# Patient Record
Sex: Female | Born: 1950 | Race: White | Hispanic: No | Marital: Married | Smoking: Never smoker
Health system: Southern US, Community
[De-identification: ages and names within clinical notes are randomized; demographics above are authoritative.]

## PROBLEM LIST (undated history)

## (undated) DIAGNOSIS — Z9889 Other specified postprocedural states: Secondary | ICD-10-CM

## (undated) DIAGNOSIS — L639 Alopecia areata, unspecified: Secondary | ICD-10-CM

## (undated) DIAGNOSIS — R112 Nausea with vomiting, unspecified: Secondary | ICD-10-CM

## (undated) DIAGNOSIS — T8859XA Other complications of anesthesia, initial encounter: Secondary | ICD-10-CM

## (undated) DIAGNOSIS — T4145XA Adverse effect of unspecified anesthetic, initial encounter: Secondary | ICD-10-CM

## (undated) HISTORY — PX: TMJ ARTHROSCOPY: SHX1067

## (undated) HISTORY — PX: ORIF DISTAL RADIUS FRACTURE: SUR927

## (undated) HISTORY — PX: HEMORRHOID SURGERY: SHX153

## (undated) HISTORY — PX: WISDOM TOOTH EXTRACTION: SHX21

## (undated) HISTORY — PX: BACK SURGERY: SHX140

---

## 1997-07-12 ENCOUNTER — Other Ambulatory Visit: Admission: RE | Admit: 1997-07-12 | Discharge: 1997-07-12 | Payer: Self-pay | Admitting: Obstetrics and Gynecology

## 1998-06-12 ENCOUNTER — Ambulatory Visit (HOSPITAL_COMMUNITY): Admission: RE | Admit: 1998-06-12 | Discharge: 1998-06-12 | Payer: Self-pay | Admitting: Neurosurgery

## 1998-06-12 ENCOUNTER — Encounter: Payer: Self-pay | Admitting: Neurosurgery

## 1998-06-28 ENCOUNTER — Encounter: Payer: Self-pay | Admitting: Neurosurgery

## 1998-06-28 ENCOUNTER — Ambulatory Visit (HOSPITAL_COMMUNITY): Admission: RE | Admit: 1998-06-28 | Discharge: 1998-06-28 | Payer: Self-pay | Admitting: Neurosurgery

## 1998-07-12 ENCOUNTER — Ambulatory Visit (HOSPITAL_COMMUNITY): Admission: RE | Admit: 1998-07-12 | Discharge: 1998-07-12 | Payer: Self-pay | Admitting: Neurosurgery

## 1998-07-12 ENCOUNTER — Encounter: Payer: Self-pay | Admitting: Neurosurgery

## 1999-02-13 ENCOUNTER — Other Ambulatory Visit: Admission: RE | Admit: 1999-02-13 | Discharge: 1999-02-13 | Payer: Self-pay | Admitting: Obstetrics and Gynecology

## 1999-04-29 ENCOUNTER — Emergency Department (HOSPITAL_COMMUNITY): Admission: EM | Admit: 1999-04-29 | Discharge: 1999-04-29 | Payer: Self-pay | Admitting: Emergency Medicine

## 1999-05-02 ENCOUNTER — Encounter (HOSPITAL_COMMUNITY): Admission: RE | Admit: 1999-05-02 | Discharge: 1999-07-31 | Payer: Self-pay | Admitting: Emergency Medicine

## 1999-06-04 ENCOUNTER — Ambulatory Visit (HOSPITAL_COMMUNITY): Admission: RE | Admit: 1999-06-04 | Discharge: 1999-06-04 | Payer: Self-pay | Admitting: Gastroenterology

## 2000-08-05 ENCOUNTER — Ambulatory Visit (HOSPITAL_BASED_OUTPATIENT_CLINIC_OR_DEPARTMENT_OTHER): Admission: RE | Admit: 2000-08-05 | Discharge: 2000-08-06 | Payer: Self-pay | Admitting: Surgery

## 2001-04-15 ENCOUNTER — Encounter: Payer: Self-pay | Admitting: Neurosurgery

## 2001-04-15 ENCOUNTER — Encounter: Admission: RE | Admit: 2001-04-15 | Discharge: 2001-04-15 | Payer: Self-pay | Admitting: Neurosurgery

## 2001-05-01 ENCOUNTER — Encounter: Payer: Self-pay | Admitting: Neurosurgery

## 2001-05-01 ENCOUNTER — Encounter: Admission: RE | Admit: 2001-05-01 | Discharge: 2001-05-01 | Payer: Self-pay | Admitting: Neurosurgery

## 2001-05-15 ENCOUNTER — Encounter: Admission: RE | Admit: 2001-05-15 | Discharge: 2001-05-15 | Payer: Self-pay | Admitting: Neurosurgery

## 2001-05-15 ENCOUNTER — Encounter: Payer: Self-pay | Admitting: Neurosurgery

## 2001-07-29 ENCOUNTER — Inpatient Hospital Stay (HOSPITAL_COMMUNITY): Admission: RE | Admit: 2001-07-29 | Discharge: 2001-07-31 | Payer: Self-pay | Admitting: Neurosurgery

## 2001-07-29 ENCOUNTER — Encounter: Payer: Self-pay | Admitting: Neurosurgery

## 2001-09-18 ENCOUNTER — Encounter: Admission: RE | Admit: 2001-09-18 | Discharge: 2001-09-18 | Payer: Self-pay

## 2001-10-30 ENCOUNTER — Encounter: Admission: RE | Admit: 2001-10-30 | Discharge: 2001-10-30 | Payer: Self-pay

## 2001-12-09 ENCOUNTER — Emergency Department (HOSPITAL_COMMUNITY): Admission: EM | Admit: 2001-12-09 | Discharge: 2001-12-09 | Payer: Self-pay | Admitting: *Deleted

## 2002-02-04 HISTORY — PX: LUMBAR LAMINECTOMY: SHX95

## 2002-07-02 ENCOUNTER — Encounter: Payer: Self-pay | Admitting: *Deleted

## 2002-07-02 ENCOUNTER — Encounter: Admission: RE | Admit: 2002-07-02 | Discharge: 2002-07-02 | Payer: Self-pay | Admitting: *Deleted

## 2003-12-30 ENCOUNTER — Emergency Department (HOSPITAL_COMMUNITY): Admission: EM | Admit: 2003-12-30 | Discharge: 2003-12-31 | Payer: Self-pay | Admitting: Emergency Medicine

## 2004-02-01 ENCOUNTER — Encounter: Admission: RE | Admit: 2004-02-01 | Discharge: 2004-02-01 | Payer: Self-pay | Admitting: Internal Medicine

## 2013-10-22 ENCOUNTER — Ambulatory Visit (HOSPITAL_COMMUNITY)
Admission: RE | Admit: 2013-10-22 | Discharge: 2013-10-22 | Disposition: A | Discharge status: Home / Self Care | Payer: No Typology Code available for payment source | Source: Ambulatory Visit | Attending: Internal Medicine | Admitting: Internal Medicine

## 2013-10-22 ENCOUNTER — Other Ambulatory Visit (HOSPITAL_COMMUNITY): Payer: Self-pay | Admitting: Internal Medicine

## 2013-10-22 DIAGNOSIS — K5732 Diverticulitis of large intestine without perforation or abscess without bleeding: Secondary | ICD-10-CM | POA: Diagnosis not present

## 2013-10-22 DIAGNOSIS — N859 Noninflammatory disorder of uterus, unspecified: Secondary | ICD-10-CM | POA: Insufficient documentation

## 2013-10-22 DIAGNOSIS — R1032 Left lower quadrant pain: Secondary | ICD-10-CM

## 2013-10-22 MED ORDER — IOHEXOL 300 MG/ML  SOLN
100.0000 mL | Freq: Once | INTRAMUSCULAR | Status: AC | PRN
  Administered 2013-10-22: 100 mL via INTRAVENOUS

## 2013-12-02 ENCOUNTER — Other Ambulatory Visit: Payer: Self-pay | Admitting: Gastroenterology

## 2014-02-15 ENCOUNTER — Other Ambulatory Visit: Payer: Self-pay | Admitting: Gastroenterology

## 2014-02-17 ENCOUNTER — Encounter (HOSPITAL_COMMUNITY): Payer: Self-pay | Admitting: *Deleted

## 2014-03-01 ENCOUNTER — Ambulatory Visit (HOSPITAL_COMMUNITY)
Admission: RE | Admit: 2014-03-01 | Payer: No Typology Code available for payment source | Source: Ambulatory Visit | Admitting: Gastroenterology

## 2014-03-01 HISTORY — DX: Other specified postprocedural states: R11.2

## 2014-03-01 HISTORY — DX: Other complications of anesthesia, initial encounter: T88.59XA

## 2014-03-01 HISTORY — DX: Other specified postprocedural states: Z98.890

## 2014-03-01 HISTORY — DX: Adverse effect of unspecified anesthetic, initial encounter: T41.45XA

## 2014-03-01 SURGERY — COLONOSCOPY WITH PROPOFOL
Anesthesia: Monitor Anesthesia Care

## 2014-03-23 ENCOUNTER — Other Ambulatory Visit: Payer: Self-pay | Admitting: Gastroenterology

## 2014-04-27 ENCOUNTER — Ambulatory Visit
Admission: RE | Admit: 2014-04-27 | Discharge: 2014-04-27 | Disposition: A | Discharge status: Home / Self Care | Payer: No Typology Code available for payment source | Source: Ambulatory Visit | Attending: Internal Medicine | Admitting: Internal Medicine

## 2014-04-27 ENCOUNTER — Other Ambulatory Visit: Payer: Self-pay | Admitting: Internal Medicine

## 2014-04-27 DIAGNOSIS — O864 Pyrexia of unknown origin following delivery: Secondary | ICD-10-CM

## 2014-05-23 ENCOUNTER — Encounter (HOSPITAL_COMMUNITY): Payer: Self-pay | Admitting: *Deleted

## 2014-06-06 ENCOUNTER — Ambulatory Visit (HOSPITAL_COMMUNITY)
Admission: RE | Admit: 2014-06-06 | Payer: No Typology Code available for payment source | Source: Ambulatory Visit | Admitting: Gastroenterology

## 2014-06-06 ENCOUNTER — Encounter (HOSPITAL_COMMUNITY): Admission: RE | Payer: Self-pay | Source: Ambulatory Visit

## 2014-06-06 HISTORY — DX: Alopecia areata, unspecified: L63.9

## 2014-06-06 SURGERY — COLONOSCOPY WITH PROPOFOL
Anesthesia: Monitor Anesthesia Care

## 2014-06-23 ENCOUNTER — Encounter (HOSPITAL_COMMUNITY): Payer: Self-pay | Admitting: *Deleted

## 2014-06-23 ENCOUNTER — Other Ambulatory Visit: Payer: Self-pay | Admitting: Gastroenterology

## 2014-06-25 ENCOUNTER — Encounter (HOSPITAL_COMMUNITY): Payer: Self-pay | Admitting: Anesthesiology

## 2014-06-25 NOTE — Anesthesia Preprocedure Evaluation (Deleted)
Anesthesia Evaluation  Patient identified by MRN, date of birth, ID band Patient awake    Reviewed: Allergy & Precautions, NPO status , Patient's Chart, lab work & pertinent test results, reviewed documented beta blocker date and time   History of Anesthesia Complications (+) PONV  Airway        Dental   Pulmonary          Cardiovascular     Neuro/Psych    GI/Hepatic Blood in stool   Endo/Other    Renal/GU      Musculoskeletal   Abdominal   Peds  Hematology   Anesthesia Other Findings   Reproductive/Obstetrics                             Anesthesia Physical Anesthesia Plan  ASA: II  Anesthesia Plan: MAC   Post-op Pain Management:    Induction: Intravenous  Airway Management Planned:   Additional Equipment:   Intra-op Plan:   Post-operative Plan:   Informed Consent: I have reviewed the patients History and Physical, chart, labs and discussed the procedure including the risks, benefits and alternatives for the proposed anesthesia with the patient or authorized representative who has indicated his/her understanding and acceptance.     Plan Discussed with:   Anesthesia Plan Comments:         Anesthesia Quick Evaluation

## 2014-06-27 ENCOUNTER — Ambulatory Visit (HOSPITAL_COMMUNITY)
Admission: RE | Admit: 2014-06-27 | Payer: No Typology Code available for payment source | Source: Ambulatory Visit | Admitting: Gastroenterology

## 2014-06-27 SURGERY — COLONOSCOPY WITH PROPOFOL
Anesthesia: Monitor Anesthesia Care

## 2014-06-30 ENCOUNTER — Other Ambulatory Visit: Payer: Self-pay | Admitting: Gastroenterology

## 2014-07-05 ENCOUNTER — Encounter (HOSPITAL_COMMUNITY): Payer: Self-pay | Admitting: *Deleted

## 2014-07-14 ENCOUNTER — Ambulatory Visit (HOSPITAL_COMMUNITY)
Admission: RE | Admit: 2014-07-14 | Discharge: 2014-07-14 | Disposition: A | Discharge status: Home / Self Care | Payer: No Typology Code available for payment source | Source: Ambulatory Visit | Attending: Gastroenterology | Admitting: Gastroenterology

## 2014-07-14 ENCOUNTER — Encounter (HOSPITAL_COMMUNITY): Payer: Self-pay | Admitting: Anesthesiology

## 2014-07-14 ENCOUNTER — Encounter (HOSPITAL_COMMUNITY)
Admission: RE | Disposition: A | Discharge status: Home / Self Care | Payer: Self-pay | Source: Ambulatory Visit | Attending: Gastroenterology

## 2014-07-14 ENCOUNTER — Ambulatory Visit (HOSPITAL_COMMUNITY): Payer: No Typology Code available for payment source | Admitting: Anesthesiology

## 2014-07-14 DIAGNOSIS — H8109 Meniere's disease, unspecified ear: Secondary | ICD-10-CM | POA: Insufficient documentation

## 2014-07-14 DIAGNOSIS — D120 Benign neoplasm of cecum: Secondary | ICD-10-CM | POA: Insufficient documentation

## 2014-07-14 DIAGNOSIS — D122 Benign neoplasm of ascending colon: Secondary | ICD-10-CM | POA: Insufficient documentation

## 2014-07-14 DIAGNOSIS — Z1211 Encounter for screening for malignant neoplasm of colon: Secondary | ICD-10-CM | POA: Insufficient documentation

## 2014-07-14 HISTORY — PX: COLONOSCOPY WITH PROPOFOL: SHX5780

## 2014-07-14 SURGERY — COLONOSCOPY WITH PROPOFOL
Anesthesia: Monitor Anesthesia Care

## 2014-07-14 MED ORDER — ONDANSETRON HCL 4 MG/2ML IJ SOLN
INTRAMUSCULAR | Status: DC | PRN
  Administered 2014-07-14: 4 mg via INTRAVENOUS

## 2014-07-14 MED ORDER — PROPOFOL INFUSION 10 MG/ML OPTIME
INTRAVENOUS | Status: DC | PRN
  Administered 2014-07-14: 100 ug/kg/min via INTRAVENOUS

## 2014-07-14 MED ORDER — KETAMINE HCL 10 MG/ML IJ SOLN
INTRAMUSCULAR | Status: DC | PRN
  Administered 2014-07-14 (×2): 10 mg via INTRAVENOUS

## 2014-07-14 MED ORDER — PROPOFOL 10 MG/ML IV BOLUS
INTRAVENOUS | Status: AC
  Filled 2014-07-14: qty 20

## 2014-07-14 MED ORDER — KETAMINE HCL 10 MG/ML IJ SOLN
INTRAMUSCULAR | Status: AC
  Filled 2014-07-14: qty 1

## 2014-07-14 MED ORDER — SODIUM CHLORIDE 0.9 % IV SOLN: INTRAVENOUS | Status: DC

## 2014-07-14 MED ORDER — PROPOFOL 10 MG/ML IV BOLUS
INTRAVENOUS | Status: DC | PRN
  Administered 2014-07-14 (×6): 20 mg via INTRAVENOUS

## 2014-07-14 MED ORDER — LACTATED RINGERS IV SOLN
INTRAVENOUS | Status: DC
  Administered 2014-07-14: 11:00:00 via INTRAVENOUS
  Administered 2014-07-14: 1000 mL via INTRAVENOUS

## 2014-07-14 MED ORDER — ONDANSETRON HCL 4 MG/2ML IJ SOLN
INTRAMUSCULAR | Status: AC
  Filled 2014-07-14: qty 2

## 2014-07-14 SURGICAL SUPPLY — 21 items

## 2014-07-14 NOTE — Transfer of Care (Signed)
Immediate Anesthesia Transfer of Care Note  Patient: Michele Griffin  Procedure(s) Performed: Procedure(s): COLONOSCOPY WITH PROPOFOL (N/A)  Patient Location: PACU  Anesthesia Type:MAC  Level of Consciousness:  sedated, patient cooperative and responds to stimulation  Airway & Oxygen Therapy:Patient Spontanous Breathing and Patient connected to face mask oxgen  Post-op Assessment:  Report given to PACU RN and Post -op Vital signs reviewed and stable  Post vital signs:  Reviewed and stable  Last Vitals:  Filed Vitals:   07/14/14 1019  BP: 159/95  Temp: 36.6 C  Resp: 16    Complications: No apparent anesthesia complications

## 2014-07-14 NOTE — Discharge Instructions (Signed)
Colonoscopy, Care After °Refer to this sheet in the next few weeks. These instructions provide you with information on caring for yourself after your procedure. Your health care provider may also give you more specific instructions. Your treatment has been planned according to current medical practices, but problems sometimes occur. Call your health care provider if you have any problems or questions after your procedure. °WHAT TO EXPECT AFTER THE PROCEDURE  °After your procedure, it is typical to have the following: °· A small amount of blood in your stool. °· Moderate amounts of gas and mild abdominal cramping or bloating. °HOME CARE INSTRUCTIONS °· Do not drive, operate machinery, or sign important documents for 24 hours. °· You may shower and resume your regular physical activities, but move at a slower pace for the first 24 hours. °· Take frequent rest periods for the first 24 hours. °· Walk around or put a warm pack on your abdomen to help reduce abdominal cramping and bloating. °· Drink enough fluids to keep your urine clear or pale yellow. °· You may resume your normal diet as instructed by your health care provider. Avoid heavy or fried foods that are hard to digest. °· Avoid drinking alcohol for 24 hours or as instructed by your health care provider. °· Only take over-the-counter or prescription medicines as directed by your health care provider. °· If a tissue sample (biopsy) was taken during your procedure: °¨ Do not take aspirin or blood thinners for 7 days, or as instructed by your health care provider. °¨ Do not drink alcohol for 7 days, or as instructed by your health care provider. °¨ Eat soft foods for the first 24 hours. °SEEK MEDICAL CARE IF: °You have persistent spotting of blood in your stool 2-3 days after the procedure. °SEEK IMMEDIATE MEDICAL CARE IF: °· You have more than a small spotting of blood in your stool. °· You pass large blood clots in your stool. °· Your abdomen is swollen  (distended). °· You have nausea or vomiting. °· You have a fever. °· You have increasing abdominal pain that is not relieved with medicine. °Document Released: 09/05/2003 Document Revised: 11/11/2012 Document Reviewed: 09/28/2012 °[employer]® Patient Information ©2015 [employer]. This information is not intended to replace advice given to you by your health care provider. Make sure you discuss any questions you have with your health care provider. ° °Conscious Sedation, Adult, Care After °Refer to this sheet in the next few weeks. These instructions provide you with information on caring for yourself after your procedure. Your health care provider may also give you more specific instructions. Your treatment has been planned according to current medical practices, but problems sometimes occur. Call your health care provider if you have any problems or questions after your procedure. °WHAT TO EXPECT AFTER THE PROCEDURE  °After your procedure: °· You may feel sleepy, clumsy, and have poor balance for several hours. °· Vomiting may occur if you eat too soon after the procedure. °HOME CARE INSTRUCTIONS °· Do not participate in any activities where you could become injured for at least 24 hours. Do not: °¨ Drive. °¨ Swim. °¨ Ride a bicycle. °¨ Operate heavy machinery. °¨ Cook. °¨ Use power tools. °¨ Climb ladders. °¨ Work from a high place. °· Do not make important decisions or sign legal documents until you are improved. °· If you vomit, drink water, juice, or soup when you can drink without vomiting. Make sure you have little or no nausea before eating solid foods. °·   Only take over-the-counter or prescription medicines for pain, discomfort, or fever as directed by your health care provider. °· Make sure you and your family fully understand everything about the medicines given to you, including what side effects may occur. °· You should not drink alcohol, take sleeping pills, or take medicines that cause drowsiness for  at least 24 hours. °· If you smoke, do not smoke without supervision. °· If you are feeling better, you may resume normal activities 24 hours after you were sedated. °· Keep all appointments with your health care provider. °SEEK MEDICAL CARE IF: °· Your skin is pale or bluish in color. °· You continue to feel nauseous or vomit. °· Your pain is getting worse and is not helped by medicine. °· You have bleeding or swelling. °· You are still sleepy or feeling clumsy after 24 hours. °SEEK IMMEDIATE MEDICAL CARE IF: °· You develop a rash. °· You have difficulty breathing. °· You develop any type of allergic problem. °· You have a fever. °MAKE SURE YOU: °· Understand these instructions. °· Will watch your condition. °· Will get help right away if you are not doing well or get worse. °Document Released: 11/11/2012 Document Reviewed: 11/11/2012 °[employer]® Patient Information ©2015 [employer]. This information is not intended to replace advice given to you by your health care provider. Make sure you discuss any questions you have with your health care provider. ° ° °

## 2014-07-14 NOTE — Anesthesia Preprocedure Evaluation (Signed)
Anesthesia Evaluation  Patient identified by MRN, date of birth, ID band Patient awake    Reviewed: Allergy & Precautions, NPO status , Patient's Chart, lab work & pertinent test results  History of Anesthesia Complications (+) PONV and history of anesthetic complications  Airway Mallampati: II  TM Distance: >3 FB Neck ROM: Full    Dental no notable dental hx.    Pulmonary neg pulmonary ROS,  breath sounds clear to auscultation  Pulmonary exam normal       Cardiovascular negative cardio ROS Normal cardiovascular examRhythm:Regular Rate:Normal     Neuro/Psych negative neurological ROS  negative psych ROS   GI/Hepatic negative GI ROS, Neg liver ROS,   Endo/Other  negative endocrine ROS  Renal/GU negative Renal ROS  negative genitourinary   Musculoskeletal negative musculoskeletal ROS (+)   Abdominal   Peds negative pediatric ROS (+)  Hematology negative hematology ROS (+)   Anesthesia Other Findings   Reproductive/Obstetrics negative OB ROS                             Anesthesia Physical Anesthesia Plan  ASA: II  Anesthesia Plan: MAC   Post-op Pain Management:    Induction: Intravenous  Airway Management Planned: Natural Airway  Additional Equipment:   Intra-op Plan:   Post-operative Plan:   Informed Consent: I have reviewed the patients History and Physical, chart, labs and discussed the procedure including the risks, benefits and alternatives for the proposed anesthesia with the patient or authorized representative who has indicated his/her understanding and acceptance.   Dental advisory given  Plan Discussed with: CRNA  Anesthesia Plan Comments:         Anesthesia Quick Evaluation

## 2014-07-14 NOTE — H&P (Signed)
  Procedure: Screening colonoscopy to evaluate positive Cologuard colon cancer screening test  History: The patient is a 64 year old female born 1950/08/26. She is scheduled to undergo a screening colonoscopy today.  Past medical history: Celiac disease. Mnire's disease. Rosacea. Fatty appearing liver on CT scan performed in September 2015. Hemorrhoidectomy. Right arm fracture surgeries. Lumbar laminectomy.  Medication allergies: None  Exam: The patient is alert and lying comfortably on the endoscopy stretcher. Abdomen is soft and nontender to palpation. Lungs are clear to auscultation. Cardiac exam reveals a regular rhythm.  Plan: Proceed with screening colonoscopy

## 2014-07-14 NOTE — Anesthesia Postprocedure Evaluation (Signed)
  Anesthesia Post-op Note  Patient: Michele Griffin  Procedure(s) Performed: Procedure(s) (LRB): COLONOSCOPY WITH PROPOFOL (N/A)  Patient Location: PACU  Anesthesia Type: MAC  Level of Consciousness: awake and alert   Airway and Oxygen Therapy: Patient Spontanous Breathing  Post-op Pain: mild  Post-op Assessment: Post-op Vital signs reviewed, Patient's Cardiovascular Status Stable, Respiratory Function Stable, Patent Airway and No signs of Nausea or vomiting  Last Vitals:  Filed Vitals:   07/14/14 1150  BP: 138/82  Pulse: 63  Temp:   Resp: 16    Post-op Vital Signs: stable   Complications: No apparent anesthesia complications

## 2014-07-14 NOTE — Op Note (Signed)
Procedure: Screening colonoscopy  Endoscopist: Earle Gell  Premedication: Propofol administered by anesthesia  Procedure: The patient was placed in the left lateral decubitus position. Anal inspection and digital rectal exam were normal. The Pentax pediatric colonoscope was introduced into the rectum and advanced to the cecum. A normal-appearing appendiceal orifice was identified. A normal-appearing ileocecal valve was intubated and the terminal ileum inspected. Colonic preparation for the exam today was good. Withdrawal time was 38 minutes  Rectum. Normal. Retroflexed view of the distal rectum was normal  Sigmoid colon and descending colon. Normal  Splenic flexure. Normal  Transverse colon. Normal  Hepatic flexure. Normal  Ascending colon. From the distal ascending colon, a 6 mm sessile serrated appearing polyp was removed with the snare.  Cecum and ileocecal valve. From the distal cecum, a lipomatous appearing polyp approximately 8 mm in diameter was removed in piecemeal fashion with the electrocautery snare.  Terminal ileum. Normal  Assessment: A small lipomatous appearing polyp in the distal cecum was removed with the electrocautery snare and a small sessile serrated appearing polyp was removed from the distal ascending colon. Otherwise normal colonoscopy.  Recommendation: I will evaluate the polyp pathology to determine when the patient should undergo a repeat colonoscopy

## 2014-07-15 ENCOUNTER — Encounter (HOSPITAL_COMMUNITY): Payer: Self-pay | Admitting: Gastroenterology

## 2014-12-12 ENCOUNTER — Encounter: Payer: Self-pay | Admitting: Obstetrics & Gynecology

## 2015-10-20 ENCOUNTER — Ambulatory Visit (INDEPENDENT_AMBULATORY_CARE_PROVIDER_SITE_OTHER): Payer: No Typology Code available for payment source | Admitting: Psychology

## 2015-10-20 DIAGNOSIS — F330 Major depressive disorder, recurrent, mild: Secondary | ICD-10-CM

## 2015-10-27 ENCOUNTER — Ambulatory Visit (INDEPENDENT_AMBULATORY_CARE_PROVIDER_SITE_OTHER): Payer: No Typology Code available for payment source | Admitting: Psychology

## 2015-10-27 DIAGNOSIS — F330 Major depressive disorder, recurrent, mild: Secondary | ICD-10-CM

## 2015-11-08 ENCOUNTER — Ambulatory Visit: Payer: No Typology Code available for payment source | Admitting: Psychology

## 2015-11-17 ENCOUNTER — Ambulatory Visit (INDEPENDENT_AMBULATORY_CARE_PROVIDER_SITE_OTHER): Payer: No Typology Code available for payment source | Admitting: Psychology

## 2015-11-17 DIAGNOSIS — F330 Major depressive disorder, recurrent, mild: Secondary | ICD-10-CM

## 2015-11-29 ENCOUNTER — Ambulatory Visit (INDEPENDENT_AMBULATORY_CARE_PROVIDER_SITE_OTHER): Payer: No Typology Code available for payment source | Admitting: Psychology

## 2015-11-29 DIAGNOSIS — F330 Major depressive disorder, recurrent, mild: Secondary | ICD-10-CM

## 2015-12-06 ENCOUNTER — Ambulatory Visit (INDEPENDENT_AMBULATORY_CARE_PROVIDER_SITE_OTHER): Payer: No Typology Code available for payment source | Admitting: Psychology

## 2015-12-06 DIAGNOSIS — F330 Major depressive disorder, recurrent, mild: Secondary | ICD-10-CM

## 2015-12-13 ENCOUNTER — Ambulatory Visit (INDEPENDENT_AMBULATORY_CARE_PROVIDER_SITE_OTHER): Payer: No Typology Code available for payment source | Admitting: Psychology

## 2015-12-13 DIAGNOSIS — F330 Major depressive disorder, recurrent, mild: Secondary | ICD-10-CM

## 2015-12-20 ENCOUNTER — Ambulatory Visit (INDEPENDENT_AMBULATORY_CARE_PROVIDER_SITE_OTHER): Payer: No Typology Code available for payment source | Admitting: Psychology

## 2015-12-20 DIAGNOSIS — F330 Major depressive disorder, recurrent, mild: Secondary | ICD-10-CM

## 2015-12-27 ENCOUNTER — Ambulatory Visit: Payer: No Typology Code available for payment source | Admitting: Psychology

## 2016-01-03 ENCOUNTER — Ambulatory Visit (INDEPENDENT_AMBULATORY_CARE_PROVIDER_SITE_OTHER): Payer: No Typology Code available for payment source | Admitting: Psychology

## 2016-01-03 DIAGNOSIS — F330 Major depressive disorder, recurrent, mild: Secondary | ICD-10-CM

## 2016-01-10 ENCOUNTER — Ambulatory Visit: Payer: No Typology Code available for payment source | Admitting: Psychology

## 2016-01-17 ENCOUNTER — Ambulatory Visit (INDEPENDENT_AMBULATORY_CARE_PROVIDER_SITE_OTHER): Payer: No Typology Code available for payment source | Admitting: Psychology

## 2016-01-17 DIAGNOSIS — F330 Major depressive disorder, recurrent, mild: Secondary | ICD-10-CM | POA: Diagnosis not present

## 2016-01-24 ENCOUNTER — Ambulatory Visit (INDEPENDENT_AMBULATORY_CARE_PROVIDER_SITE_OTHER): Payer: No Typology Code available for payment source | Admitting: Psychology

## 2016-01-24 DIAGNOSIS — F330 Major depressive disorder, recurrent, mild: Secondary | ICD-10-CM | POA: Diagnosis not present

## 2016-01-26 IMAGING — CR DG CHEST 2V
2 series · 2 of 2 positions shown · non-contrast
Comparison: None.

CLINICAL DATA: Persistent cough and fever.

EXAM:
CHEST  2 VIEW

[view not recorded (1 of 2)]
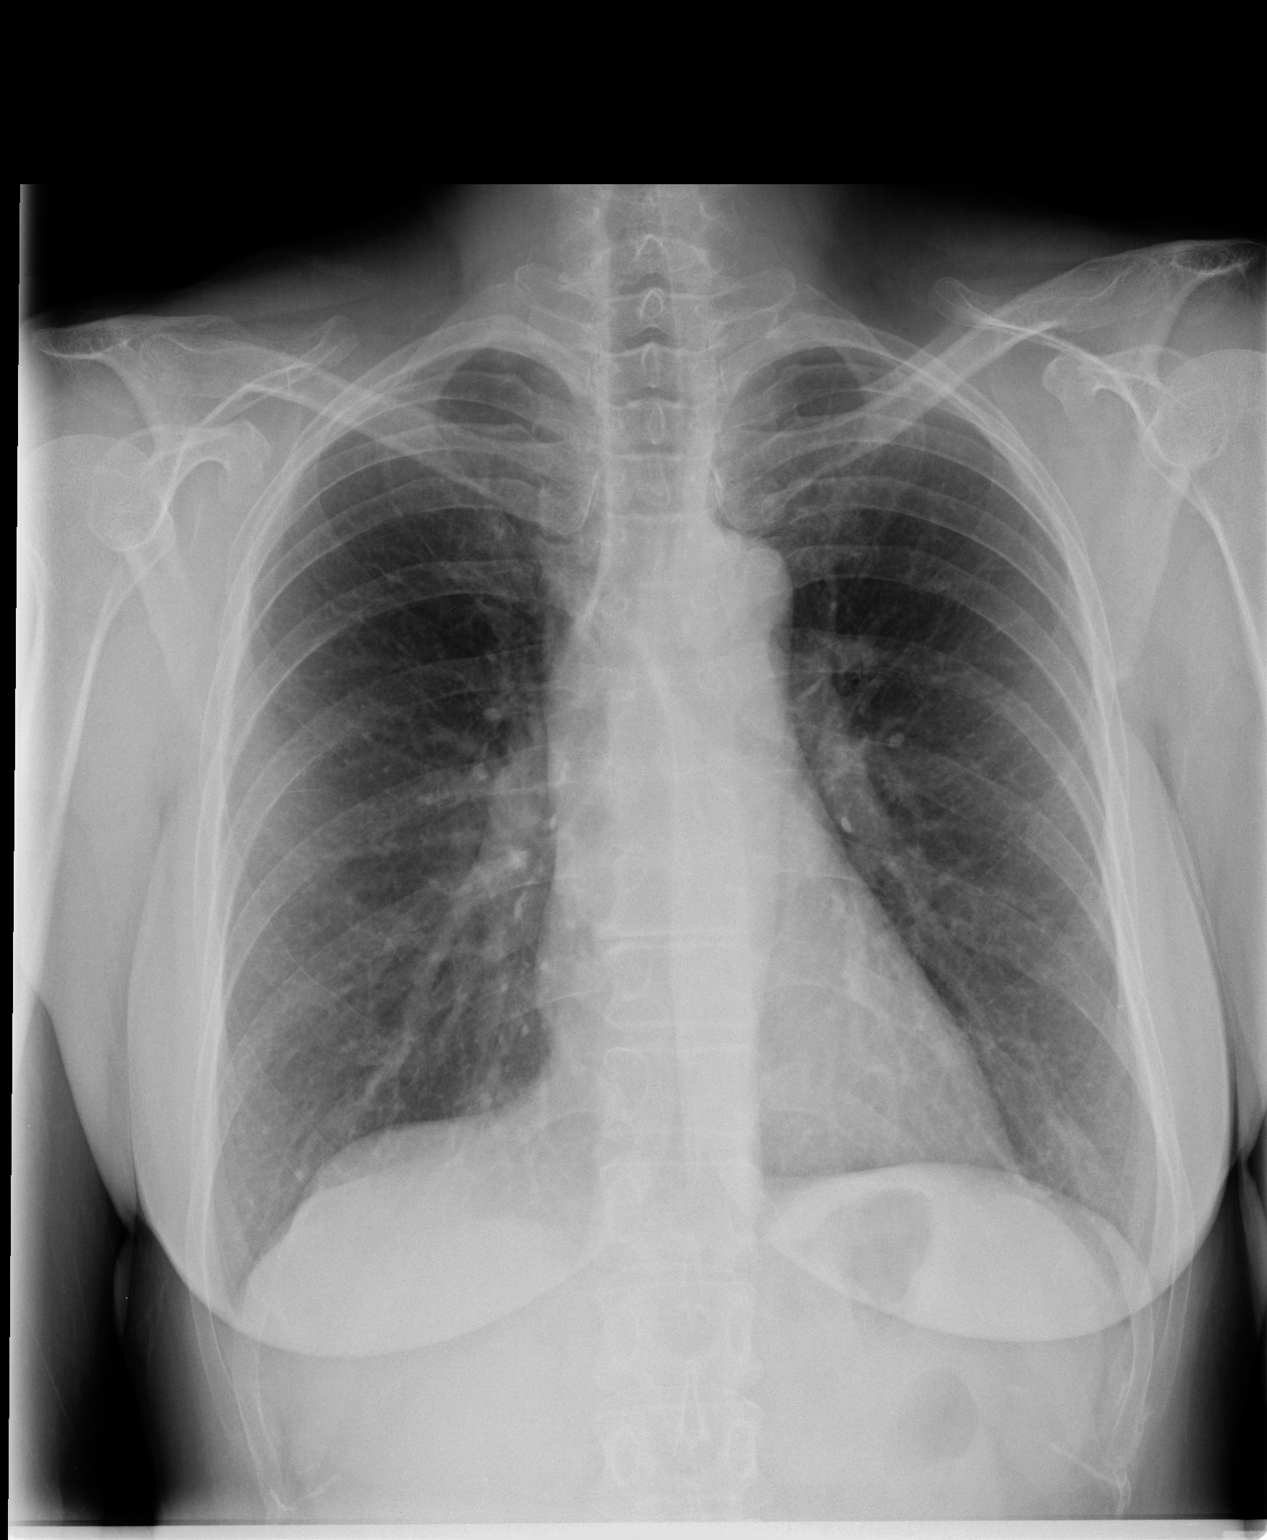

[view not recorded (2 of 2)]
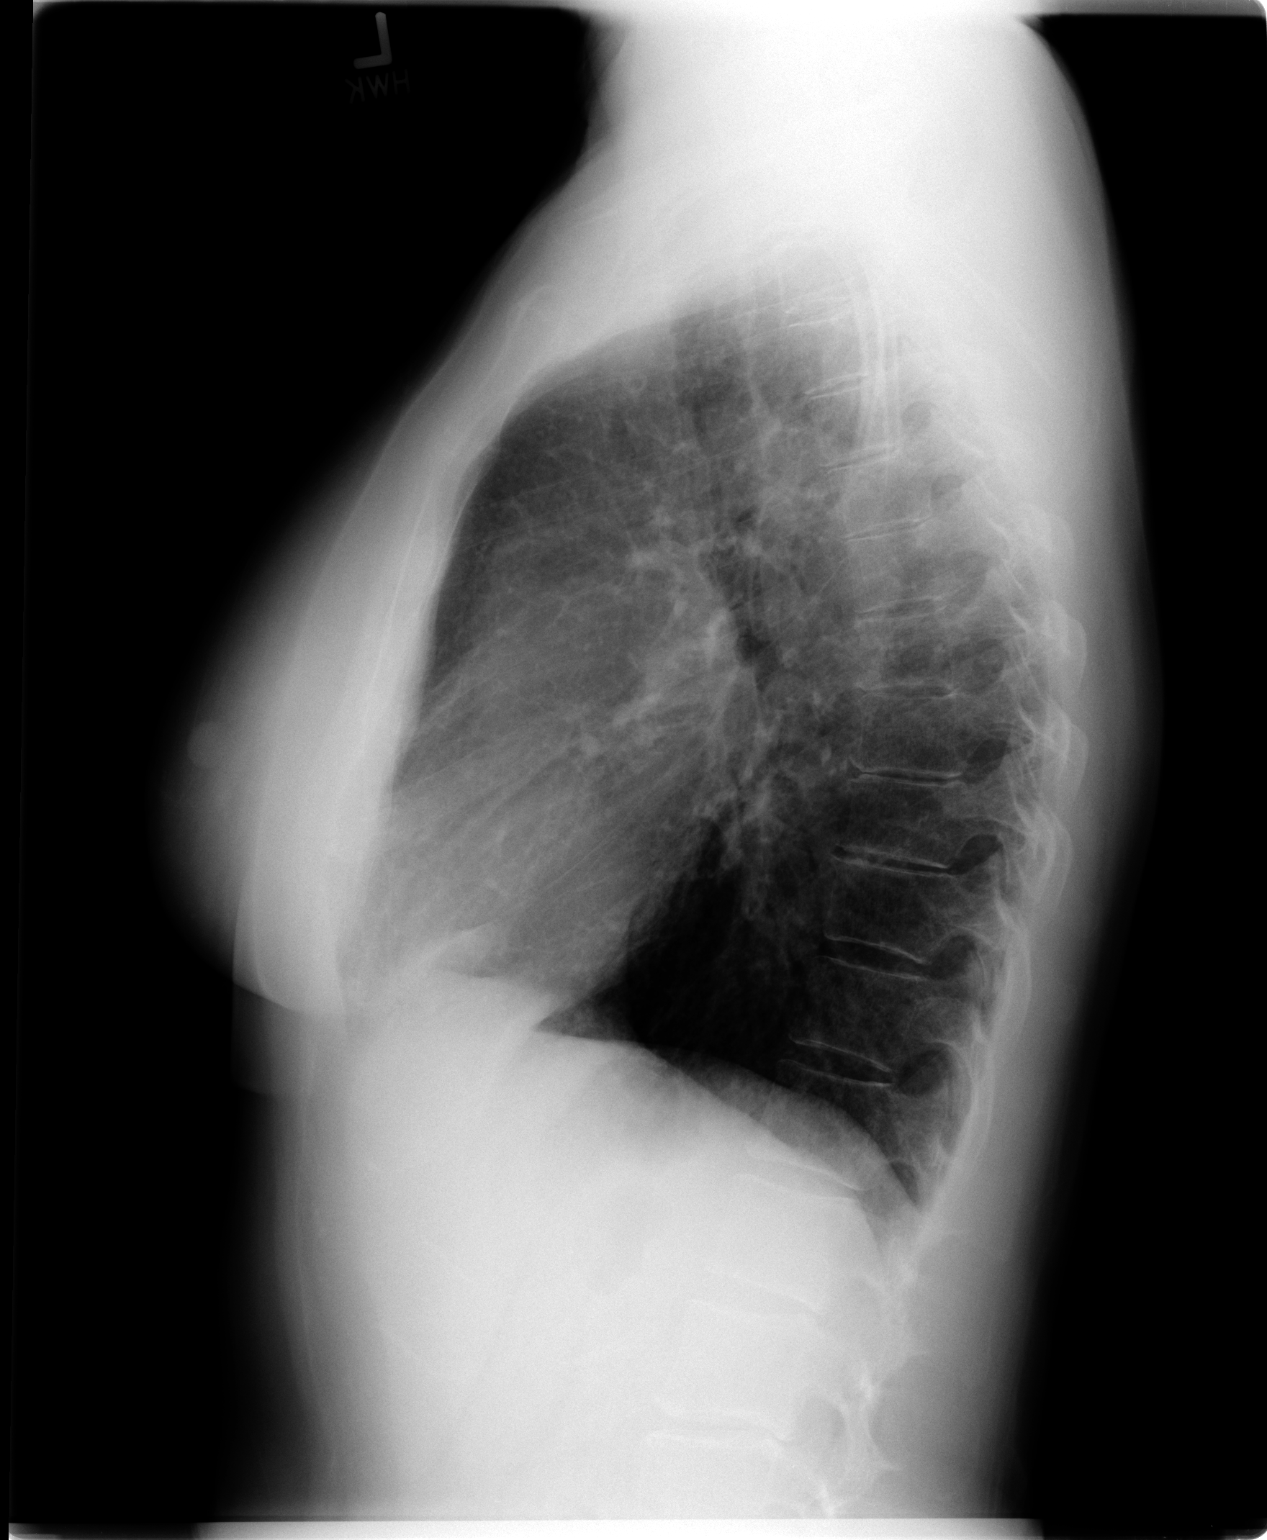

[2 of 2 positions shown; findings below may reference images not displayed]

FINDINGS: Mediastinum and hilar structures are normal. Heart size normal.
Pulmonary vascularity normal. Mild pulmonary interstitial prominence
noted suggesting pneumonitis. No pleural effusion or pneumothorax.
No acute bony abnormality .
IMPRESSION: Mild bilateral pulmonary interstitial prominence noted suggesting
mild pneumonitis.

## 2016-01-31 ENCOUNTER — Ambulatory Visit: Payer: No Typology Code available for payment source | Admitting: Psychology

## 2017-02-26 ENCOUNTER — Ambulatory Visit: Payer: Medicare Other | Admitting: Psychology

## 2017-03-12 ENCOUNTER — Ambulatory Visit: Payer: Self-pay | Admitting: Psychology

## 2017-03-26 ENCOUNTER — Ambulatory Visit: Payer: Self-pay | Admitting: Psychology

## 2017-04-09 ENCOUNTER — Ambulatory Visit: Payer: Self-pay | Admitting: Psychology

## 2018-02-03 ENCOUNTER — Encounter: Payer: Self-pay | Admitting: Obstetrics & Gynecology

## 2018-02-03 ENCOUNTER — Ambulatory Visit: Payer: Medicare Other | Admitting: Obstetrics & Gynecology

## 2018-02-03 VITALS — BP 112/70 | Ht 64.0 in | Wt 125.0 lb

## 2018-02-03 DIAGNOSIS — Z13820 Encounter for screening for osteoporosis: Secondary | ICD-10-CM | POA: Diagnosis not present

## 2018-02-03 DIAGNOSIS — Z780 Asymptomatic menopausal state: Secondary | ICD-10-CM

## 2018-02-03 DIAGNOSIS — Z01419 Encounter for gynecological examination (general) (routine) without abnormal findings: Secondary | ICD-10-CM | POA: Diagnosis not present

## 2018-02-03 NOTE — Progress Notes (Signed)
TU SHIMMEL Dec 09, 1950 [id number]   History:    67 y.o. G5P2A3L2 Married.  2 sons.  RP:  Established patient presenting for annual gyn exam   HPI: Menopause, well on no HRT.  No PMB.  No pelvic pain.  Husband with erectile dysfunction on treatment.  Mild dryness/pain when sexually active, will try coconut oil. Breasts normal.  Urine normal.  IBS under nutritional treatment and Cymbalta.  Health labs with Fam MD.  Past medical history,surgical history, family history and social history were all reviewed and documented in the EPIC chart.  Gynecologic History No LMP recorded. Patient is postmenopausal. Contraception: post menopausal status Last Pap: 2017. Results were: normal per patient Last mammogram: 2017. Results were: normal per patient Bone Density: Many years ago.  Will schedule here now. Colonoscopy: 2016  Obstetric History OB History  Gravida Para Term Preterm AB Living  5 2     3 2   SAB TAB Ectopic Multiple Live Births               # Outcome Date GA Lbr Len/2nd Weight Sex Delivery Anes PTL Lv  5 AB           4 AB           3 AB           2 Para           1 Para              ROS: A ROS was performed and pertinent positives and negatives are included in the history.  GENERAL: No fevers or chills. HEENT: No change in vision, no earache, sore throat or sinus congestion. NECK: No pain or stiffness. CARDIOVASCULAR: No chest pain or pressure. No palpitations. PULMONARY: No shortness of breath, cough or wheeze. GASTROINTESTINAL: No abdominal pain, nausea, vomiting or diarrhea, melena or bright red blood per rectum. GENITOURINARY: No urinary frequency, urgency, hesitancy or dysuria. MUSCULOSKELETAL: No joint or muscle pain, no back pain, no recent trauma. DERMATOLOGIC: No rash, no itching, no lesions. ENDOCRINE: No polyuria, polydipsia, no heat or cold intolerance. No recent change in weight. HEMATOLOGICAL: No anemia or easy bruising or bleeding. NEUROLOGIC: No headache,  seizures, numbness, tingling or weakness. PSYCHIATRIC: No depression, no loss of interest in normal activity or change in sleep pattern.     Exam:   BP 112/70   Ht 5\' 4"  (1.626 m)   Wt 125 lb (56.7 kg)   BMI 21.46 kg/m   Body mass index is 21.46 kg/m.  General appearance : Well developed well nourished female. No acute distress HEENT: Eyes: no retinal hemorrhage or exudates,  Neck supple, trachea midline, no carotid bruits, no thyroidmegaly Lungs: Clear to auscultation, no rhonchi or wheezes, or rib retractions  Heart: Regular rate and rhythm, no murmurs or gallops Breast:Examined in sitting and supine position were symmetrical in appearance, no palpable masses or tenderness,  no skin retraction, no nipple inversion, no nipple discharge, no skin discoloration, no axillary or supraclavicular lymphadenopathy Abdomen: no palpable masses or tenderness, no rebound or guarding Extremities: no edema or skin discoloration or tenderness  Pelvic: Vulva: Normal             Vagina: No gross lesions or discharge  Cervix: No gross lesions or discharge.  Pap reflex done  Uterus  AV, normal size, shape and consistency, non-tender and mobile  Adnexa  Without masses or tenderness  Anus: Normal   Assessment/Plan:  67 y.o. female for  annual exam   1. Encounter for routine gynecological examination with Papanicolaou smear of cervix Normal gynecologic exam in menopause.  Pap reflex done.  Breast normal.  Will schedule a screening mammogram now.  Health labs with family physician.  Colonoscopy in 2016.  Good body mass index at 21.46.  Continue with fitness and healthy nutrition.  2. Postmenopausal Well on no hormone replacement therapy.  No postmenopausal bleeding.  3. Screening for osteoporosis Will schedule bone density here now.  Vitamin D supplements, calcium intake of 1.5 g/day and regular weightbearing physical activities recommended. - DG Bone Density; Future  Other orders - DULoxetine  (CYMBALTA) 30 MG capsule; Take 30 mg by mouth daily. - L-GLUTAMINE PO; Take by mouth.  Princess Bruins MD, 11:32 AM 02/03/2018

## 2018-02-04 ENCOUNTER — Encounter: Payer: Self-pay | Admitting: Obstetrics & Gynecology

## 2018-02-05 LAB — PAP IG W/ RFLX HPV ASCU

## 2018-04-15 ENCOUNTER — Other Ambulatory Visit: Payer: Self-pay

## 2018-04-15 ENCOUNTER — Ambulatory Visit (INDEPENDENT_AMBULATORY_CARE_PROVIDER_SITE_OTHER): Payer: Medicare Other

## 2018-04-15 ENCOUNTER — Other Ambulatory Visit: Payer: Self-pay | Admitting: Obstetrics & Gynecology

## 2018-04-15 DIAGNOSIS — M810 Age-related osteoporosis without current pathological fracture: Secondary | ICD-10-CM | POA: Diagnosis not present

## 2018-04-15 DIAGNOSIS — Z780 Asymptomatic menopausal state: Secondary | ICD-10-CM

## 2018-04-15 DIAGNOSIS — Z13820 Encounter for screening for osteoporosis: Secondary | ICD-10-CM

## 2018-04-24 ENCOUNTER — Encounter: Payer: Self-pay | Admitting: *Deleted

## 2018-04-24 NOTE — Telephone Encounter (Signed)
Dr.Lavoie patient called and left voicemail asking if you could give her this information via my chart verses schedule a visit to discuss? Patient said she is high risk due all the Covid-19

## 2018-06-15 ENCOUNTER — Other Ambulatory Visit: Payer: Self-pay

## 2018-06-16 ENCOUNTER — Other Ambulatory Visit: Payer: Self-pay | Admitting: *Deleted

## 2018-06-16 ENCOUNTER — Other Ambulatory Visit: Payer: Medicare Other

## 2018-06-16 DIAGNOSIS — E559 Vitamin D deficiency, unspecified: Secondary | ICD-10-CM

## 2018-06-23 ENCOUNTER — Ambulatory Visit (INDEPENDENT_AMBULATORY_CARE_PROVIDER_SITE_OTHER): Payer: Medicare Other | Admitting: Obstetrics & Gynecology

## 2018-06-23 ENCOUNTER — Other Ambulatory Visit: Payer: Self-pay

## 2018-06-23 ENCOUNTER — Encounter: Payer: Self-pay | Admitting: Obstetrics & Gynecology

## 2018-06-23 DIAGNOSIS — M810 Age-related osteoporosis without current pathological fracture: Secondary | ICD-10-CM

## 2018-06-23 DIAGNOSIS — Z780 Asymptomatic menopausal state: Secondary | ICD-10-CM | POA: Diagnosis not present

## 2018-06-23 NOTE — Progress Notes (Signed)
    BURNIE HANK August 04, 1950 [id number]        68 y.o.  [id number] Married  Televisit consented by patient.  Attempted with Doxy for video but patient's sound was not working.  We therefore changed to a telephone visit. Televisit between patient at home and myself in my [place] office.  Bone Density showing Osteoporosis.  Counseling on Osteoporosis and review of chart during 30 minutes.  RP: Discuss Bone Density results/Counseling on Osteoporosis  HPI: Bone Density 04/15/2018 showing Osteoporosis at multiple sites, lowest level at -2.8 at the Spine.  Menopause, well on no HRT.  BMI 21.46 on 02/03/2018, weight stable per patient.  Has started Vit D supplement.  Nutrition rich in Ca++.  Fitness training with a Physiological scientist currently.  No increased risk of fall.     OB History  Gravida Para Term Preterm AB Living  5 2     3 2   SAB TAB Ectopic Multiple Live Births               # Outcome Date GA Lbr Len/2nd Weight Sex Delivery Anes PTL Lv  5 AB           4 AB           3 AB           2 Para           1 Para             Past medical history,surgical history, problem list, medications, allergies, family history and social history were all reviewed and documented in the EPIC chart.   Directed ROS with pertinent positives and negatives documented in the history of present illness/assessment and plan.  Exam:  There were no vitals filed for this visit. General appearance:  Normal  Bone Density 04/15/2018:  Osteoporosis at multiple sites, lowest at the Spine T-Score -2.8.   Assessment/Plan:  68 y.o. [id number]   1. Age-related osteoporosis without current pathological fracture Bone density April 15, 2018 showing osteoporosis at multiple sites with the lowest level at the spine with a T score of -2.8.  Patient has started a vitamin D supplement.  She will make sure that she gets a total of 1200 mg of calcium every day and her food and if not reaching that, will take a supplement.  Has started  training with a personal trainer, doing weightbearing physical activities more than 5 times a week.  Low risk of fall.  Body mass index 21.46.  Recommended to maintain her weight at that level, without losing weight.  After counseling on medical treatment for osteoporosis, patient declines treatment at this time, but agrees with repeating a bone density at 1 year to assure no further decrease in bone density with the changes in lifestyle that she is making as above.  2. Postmenopause No HRT.  - DG Bone Density; Future   Princess Bruins MD, 1:07 PM 06/23/2018
